# Patient Record
Sex: Female | Born: 1995 | Race: Black or African American | Hispanic: No | Marital: Single | Smoking: Never smoker
Health system: Southern US, Community
[De-identification: ages and names within clinical notes are randomized; demographics above are authoritative.]

## PROBLEM LIST (undated history)

## (undated) DIAGNOSIS — R569 Unspecified convulsions: Secondary | ICD-10-CM

---
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
## 2022-08-25 ENCOUNTER — Emergency Department (HOSPITAL_COMMUNITY)
Admission: EM | Admit: 2022-08-25 | Discharge: 2022-08-26 | Disposition: A | Discharge status: Home / Self Care | Payer: No Typology Code available for payment source | Source: Home / Self Care | Attending: Emergency Medicine | Admitting: Emergency Medicine

## 2022-08-25 ENCOUNTER — Encounter (HOSPITAL_COMMUNITY): Payer: Self-pay | Admitting: Emergency Medicine

## 2022-08-25 ENCOUNTER — Other Ambulatory Visit: Payer: Self-pay

## 2022-08-25 DIAGNOSIS — N9489 Other specified conditions associated with female genital organs and menstrual cycle: Secondary | ICD-10-CM | POA: Insufficient documentation

## 2022-08-25 DIAGNOSIS — G40909 Epilepsy, unspecified, not intractable, without status epilepticus: Secondary | ICD-10-CM | POA: Insufficient documentation

## 2022-08-25 DIAGNOSIS — N939 Abnormal uterine and vaginal bleeding, unspecified: Secondary | ICD-10-CM | POA: Insufficient documentation

## 2022-08-25 DIAGNOSIS — R569 Unspecified convulsions: Secondary | ICD-10-CM

## 2022-08-25 HISTORY — DX: Unspecified convulsions: R56.9

## 2022-08-25 LAB — URINALYSIS, ROUTINE W REFLEX MICROSCOPIC
Bilirubin Urine: NEGATIVE
Glucose, UA: NEGATIVE mg/dL
Hgb urine dipstick: NEGATIVE
Ketones, ur: NEGATIVE mg/dL
Leukocytes,Ua: NEGATIVE
Nitrite: NEGATIVE
Protein, ur: NEGATIVE mg/dL
Specific Gravity, Urine: 1.028 (ref 1.005–1.030)
pH: 5 (ref 5.0–8.0)

## 2022-08-25 LAB — CBC WITH DIFFERENTIAL/PLATELET
Abs Immature Granulocytes: 0.02 10*3/uL (ref 0.00–0.07)
Basophils Absolute: 0 10*3/uL (ref 0.0–0.1)
Basophils Relative: 0 %
Eosinophils Absolute: 0.1 10*3/uL (ref 0.0–0.5)
Eosinophils Relative: 2 %
HCT: 35.9 % — ABNORMAL LOW (ref 36.0–46.0)
Hemoglobin: 11.1 g/dL — ABNORMAL LOW (ref 12.0–15.0)
Immature Granulocytes: 0 %
Lymphocytes Relative: 21 %
Lymphs Abs: 1.4 10*3/uL (ref 0.7–4.0)
MCH: 22.4 pg — ABNORMAL LOW (ref 26.0–34.0)
MCHC: 30.9 g/dL (ref 30.0–36.0)
MCV: 72.5 fL — ABNORMAL LOW (ref 80.0–100.0)
Monocytes Absolute: 0.7 10*3/uL (ref 0.1–1.0)
Monocytes Relative: 10 %
Neutro Abs: 4.5 10*3/uL (ref 1.7–7.7)
Neutrophils Relative %: 67 %
Platelets: 309 10*3/uL (ref 150–400)
RBC: 4.95 MIL/uL (ref 3.87–5.11)
RDW: 14.9 % (ref 11.5–15.5)
WBC: 6.8 10*3/uL (ref 4.0–10.5)
nRBC: 0 % (ref 0.0–0.2)

## 2022-08-25 LAB — COMPREHENSIVE METABOLIC PANEL
ALT: 15 U/L (ref 0–44)
AST: 17 U/L (ref 15–41)
Albumin: 3.9 g/dL (ref 3.5–5.0)
Alkaline Phosphatase: 49 U/L (ref 38–126)
Anion gap: 7 (ref 5–15)
BUN: 12 mg/dL (ref 6–20)
CO2: 22 mmol/L (ref 22–32)
Calcium: 9.1 mg/dL (ref 8.9–10.3)
Chloride: 109 mmol/L (ref 98–111)
Creatinine, Ser: 0.89 mg/dL (ref 0.44–1.00)
GFR, Estimated: >60 mL/min (ref >60)
Glucose, Bld: 91 mg/dL (ref 70–99)
Potassium: 3.9 mmol/L (ref 3.5–5.1)
Sodium: 138 mmol/L (ref 135–145)
Total Bilirubin: 0.5 mg/dL (ref 0.3–1.2)
Total Protein: 7.1 g/dL (ref 6.5–8.1)

## 2022-08-25 LAB — I-STAT BETA HCG BLOOD, ED (MC, WL, AP ONLY): I-stat hCG, quantitative: <5 m[IU]/mL (ref <5)

## 2022-08-25 NOTE — ED Provider Triage Note (Signed)
Emergency Medicine Provider Triage Evaluation Note  Darlene Reed , a 25 y.o. female  was evaluated in triage.  Pt complains of recent seizure that took place most of yesterday and earlier this morning.  Also with some nausea however without vomiting.  Given Zofran by EMS on the way here, nausea improved.  Denies recent fevers, diarrhea, urinary or bowel incontinence, tongue biting/bleeding, though does endorse some intermittent chills.  Hx of nonepileptic seizures/pseudoseizures.  Review of Systems  Positive:  Negative: See above  Physical Exam  BP 102/75 (BP Location: Right Arm)   Pulse 82   Temp 98.2 F (36.8 C) (Oral)   Resp 18   Ht 5\' 8"  (1.727 m)   Wt 85.3 kg   SpO2 100%   BMI 28.59 kg/m  Gen:   Awake, no distress, sitting comfortably, AAOx4 Resp:  Normal effort  MSK:   Moves extremities without difficulty  Other:  No evidence of disorientation.  No evidence of urinary or bowel incontinence.  No evidence of tongue biting or bleeding in the mouth.  Accompanied by family member, confirms patient did not injure herself during said seizures.  Full ROM of upper extremities, no evidence of head injury.  Medical Decision Making  Medically screening exam initiated at 6:27 PM.  Appropriate orders placed.  Darlene Reed was informed that the remainder of the evaluation will be completed by another provider, this initial triage assessment does not replace that evaluation, and the importance of remaining in the ED until their evaluation is complete.     Prince Rome, PA-C 27/25/36 1831
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
## 2022-08-26 ENCOUNTER — Other Ambulatory Visit: Payer: Self-pay

## 2022-08-26 ENCOUNTER — Emergency Department (HOSPITAL_COMMUNITY): Payer: No Typology Code available for payment source

## 2022-08-26 ENCOUNTER — Encounter (HOSPITAL_COMMUNITY): Payer: Self-pay | Admitting: Emergency Medicine

## 2022-08-26 ENCOUNTER — Inpatient Hospital Stay (HOSPITAL_COMMUNITY)
Admission: EM | Admit: 2022-08-26 | Discharge: 2022-08-30 | DRG: 101 | Disposition: A | Discharge status: Home / Self Care | Payer: No Typology Code available for payment source | Attending: Internal Medicine | Admitting: Internal Medicine

## 2022-08-26 DIAGNOSIS — R569 Unspecified convulsions: Principal | ICD-10-CM

## 2022-08-26 DIAGNOSIS — Z79899 Other long term (current) drug therapy: Secondary | ICD-10-CM

## 2022-08-26 DIAGNOSIS — G40909 Epilepsy, unspecified, not intractable, without status epilepticus: Principal | ICD-10-CM | POA: Diagnosis present

## 2022-08-26 DIAGNOSIS — E876 Hypokalemia: Secondary | ICD-10-CM | POA: Diagnosis not present

## 2022-08-26 DIAGNOSIS — D509 Iron deficiency anemia, unspecified: Secondary | ICD-10-CM | POA: Diagnosis present

## 2022-08-26 DIAGNOSIS — F32A Depression, unspecified: Secondary | ICD-10-CM | POA: Diagnosis present

## 2022-08-26 MED ORDER — LORAZEPAM 2 MG/ML IJ SOLN
0.5000 mg | Freq: Once | INTRAMUSCULAR | Status: AC
  Administered 2022-08-26: 0.5 mg via INTRAVENOUS
  Filled 2022-08-26: qty 1

## 2022-08-26 MED ORDER — SODIUM CHLORIDE 0.9 % IV SOLN: Freq: Once | INTRAVENOUS | Status: AC
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
## 2022-08-26 NOTE — Discharge Instructions (Addendum)
The episodes you have been having seem most consistent with nonepileptiform seizure activity.  These type of episodes are typically triggered by stress whether that be physical or emotional.  These types of seizures do not respond to typical seizure medication, the primary therapy is a dealing with underlying stress.  Therapy can be extremely helpful in managing the seizures.  Continue your anxiety medication.  Follow-up with your neurologist and continue working on obtaining a neurology follow-up in [place] as well.  Your labs and CT scan are reassuring today.

## 2022-08-26 NOTE — ED Notes (Signed)
Pt ambulated to restroom with steady gait.

## 2022-08-26 NOTE — ED Notes (Signed)
Transported to CT 

## 2022-08-26 NOTE — ED Notes (Signed)
PATIENT STATED THAT IV WAS HURTING REMOVED IV  FROM LEFT AC
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
## 2022-08-26 NOTE — ED Provider Notes (Signed)
[facility]-EMERGENCY DEPT Provider Note   CSN: [id number] Arrival date & time: 08/26/22  1944     History  Chief Complaint  Patient presents with   Seizures    Darlene Reed is a 26 y.o. female.  HPI Patient has history of nonelectric seizure disorder.  She however has had increasing frequency over these past several days.  She had multiple episodes of seizure activity and was evaluated last night in the emergency department.  Patient's father reports he witnessed her to be very unresponsive and with seizure-like activity.  He reports he was very concerned long the episode lasted tonight.  Reports she was concerned that she might die.  Patient reports she is really not aware of the episodes when they happen.  Reports sometimes she has fatigue and headache afterwards but at this time is not having any specific symptoms.  He denies any drug or alcohol use.  Patient is currently in law school at [employer] but came home because she was having more frequent episodes and was concerned about not having family members in the area.  She denies she has any recent increased stressors.  Denies that she feels stressed.    Home Medications Prior to Admission medications   Medication Sig Start Date End Date Taking? Authorizing Provider  escitalopram (LEXAPRO) 10 MG tablet Take 10 mg by mouth daily. 08/10/22  Yes [provider]  ferrous sulfate 325 (65 FE) MG tablet Take 1 tablet (325 mg total) by mouth daily. 08/29/22 12/27/22 Yes Osvaldo Shipper, MD      Allergies    Patient has no known allergies.    Review of Systems   Review of Systems  Physical Exam Updated Vital Signs BP 107/77 (BP Location: Right Arm)   Pulse 84   Temp 98 F (36.7 C) (Oral)   Resp 19   Ht 5\' 8"  (1.727 m)   Wt 85.3 kg   SpO2 100%   BMI 28.59 kg/m  Physical Exam Constitutional:      Appearance: Normal appearance.  HENT:     Head: Normocephalic and atraumatic.     Nose: Nose normal.      Mouth/Throat:     Mouth: Mucous membranes are moist.     Pharynx: Oropharynx is clear.  Eyes:     Extraocular Movements: Extraocular movements intact.     Pupils: Pupils are equal, round, and reactive to light.  Cardiovascular:     Rate and Rhythm: Normal rate and regular rhythm.  Pulmonary:     Effort: Pulmonary effort is normal.     Breath sounds: Normal breath sounds.  Abdominal:     General: There is no distension.     Palpations: Abdomen is soft.     Tenderness: There is no abdominal tenderness. There is no guarding.  Musculoskeletal:        General: No swelling. Normal range of motion.     Cervical back: Neck supple.     Right lower leg: No edema.     Left lower leg: No edema.  Skin:    General: Skin is warm and dry.  Neurological:     General: No focal deficit present.     Mental Status: She is alert and oriented to person, place, and time.     Cranial Nerves: No cranial nerve deficit.     Motor: No weakness.     Coordination: Coordination normal.  Psychiatric:        Mood and Affect: Mood normal.  ED Results / Procedures / Treatments   Labs (all labs ordered are listed, but only abnormal results are displayed) Labs Reviewed  BASIC METABOLIC PANEL - Abnormal; Notable for the following components:      Result Value   Potassium 3.2 (*)    Glucose, Bld 134 (*)    Calcium 8.4 (*)    All other components within normal limits  COMPREHENSIVE METABOLIC PANEL - Abnormal; Notable for the following components:   Chloride 113 (*)    Glucose, Bld 156 (*)    Calcium 8.4 (*)    Albumin 3.4 (*)    Anion gap 4 (*)    All other components within normal limits  CBC - Abnormal; Notable for the following components:   Hemoglobin 9.8 (*)    HCT 31.9 (*)    MCV 73.0 (*)    MCH 22.4 (*)    All other components within normal limits  CBC - Abnormal; Notable for the following components:   RBC 5.21 (*)    Hemoglobin 11.3 (*)    MCV 72.0 (*)    MCH 21.7 (*)    All other  components within normal limits  COMPREHENSIVE METABOLIC PANEL - Abnormal; Notable for the following components:   Glucose, Bld 109 (*)    All other components within normal limits  VITAMIN B12 - Abnormal; Notable for the following components:   Vitamin B-12 1,371 (*)    All other components within normal limits  IRON AND TIBC - Abnormal; Notable for the following components:   Saturation Ratios 9 (*)    All other components within normal limits  FERRITIN - Abnormal; Notable for the following components:   Ferritin 8 (*)    All other components within normal limits  RAPID URINE DRUG SCREEN, HOSP PERFORMED  CK  LACTIC ACID, PLASMA  HIV ANTIBODY (ROUTINE TESTING W REFLEX)  MAGNESIUM  PHOSPHORUS  FOLATE  RETICULOCYTES    EKG None  Radiology No results found.  Procedures Procedures    Medications Ordered in ED Medications  0.9 %  sodium chloride infusion (75 mL/hr Intravenous New Bag/Given 08/27/22 0844)  LORazepam (ATIVAN) injection 0.5 mg (0.5 mg Intravenous Given 08/26/22 2019)  0.9 %  sodium chloride infusion (0 mLs Intravenous Stopped 08/27/22 0516)  potassium chloride SA (KLOR-CON M) CR tablet 40 mEq (40 mEq Oral Given 08/29/22 1428)    ED Course/ Medical Decision Making/ A&P                           Medical Decision Making Amount and/or Complexity of Data Reviewed Labs: ordered.  Risk Prescription drug management. Decision regarding hospitalization.   Patient had a protracted episode of being poorly responsive and seizure-like activity this evening.  This has been recurrent multiple episodes over the past couple of days.  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  She is back to normal mental status at this time.  Consult: Reviewed with neurology Dr.Khalindique.  He has reviewed the patient's EMR and prior evaluation.  He advises that she has significant increase in seizure-like activity, admission  and continuous monitoring may be appropriate to rule out occult epileptiform seizure.  CT head and labs from earlier today reviewed.  Medications administered are 0.5 mg Ativan for seizure and possible anxiety.  Potassium supplement orally.  Fluid resuscitation with sodium chloride.  Recheck: Patient is continued to do well.  She has  maintained normal mental status.  Vital signs have remained stable.  Consult: Reviewed Dr. Roel Cluck for admission.        Final Clinical Impression(s) / ED Diagnoses Final diagnoses:  Seizure-like activity ([place])    Rx / DC Orders ED Discharge Orders          Ordered    Discharge instructions       Comments: Please follow instructions as given by the neurologist.  No driving or operating any Machinery for 6 months.  Please be sure to follow-up with your neurologist in [place].  You were cared for by a hospitalist during your hospital stay. If you have any questions about your discharge medications or the care you received while you were in the hospital after you are discharged, you can call the unit and asked to speak with the hospitalist on call if the hospitalist that took care of you is not available. Once you are discharged, your primary care physician will handle any further medical issues. Please note that NO REFILLS for any discharge medications will be authorized once you are discharged, as it is imperative that you return to your primary care physician (or establish a relationship with a primary care physician if you do not have one) for your aftercare needs so that they can reassess your need for medications and monitor your lab values. If you do not have a primary care physician, you can call [phone] for a physician referral.   08/30/22 1101    Increase activity slowly        08/30/22 1101    Call MD for:  temperature >100.4        08/30/22 1101    Diet general        08/30/22 1101    Call MD for:  persistant nausea and vomiting         08/30/22 1101    Call MD for:  severe uncontrolled pain        08/30/22 1101    Call MD for:  difficulty breathing, headache or visual disturbances        08/30/22 1101    Call MD for:  persistant dizziness or light-headedness        08/30/22 1101    Call MD for:  extreme fatigue        08/30/22 1101    ferrous sulfate 325 (65 FE) MG tablet  Daily        08/29/22 1003              Charlesetta Shanks, MD 09/03/22 1930
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
## 2022-08-27 DIAGNOSIS — F32A Depression, unspecified: Secondary | ICD-10-CM

## 2022-08-27 DIAGNOSIS — Z79899 Other long term (current) drug therapy: Secondary | ICD-10-CM | POA: Diagnosis not present

## 2022-08-27 DIAGNOSIS — R569 Unspecified convulsions: Secondary | ICD-10-CM

## 2022-08-27 DIAGNOSIS — G40909 Epilepsy, unspecified, not intractable, without status epilepticus: Secondary | ICD-10-CM

## 2022-08-27 DIAGNOSIS — E876 Hypokalemia: Secondary | ICD-10-CM | POA: Diagnosis not present

## 2022-08-27 DIAGNOSIS — D509 Iron deficiency anemia, unspecified: Secondary | ICD-10-CM | POA: Diagnosis present

## 2022-08-27 LAB — CBC
HCT: 31.9 % — ABNORMAL LOW (ref 36.0–46.0)
Hemoglobin: 9.8 g/dL — ABNORMAL LOW (ref 12.0–15.0)
MCH: 22.4 pg — ABNORMAL LOW (ref 26.0–34.0)
MCHC: 30.7 g/dL (ref 30.0–36.0)
MCV: 73 fL — ABNORMAL LOW (ref 80.0–100.0)
Platelets: 260 10*3/uL (ref 150–400)
RBC: 4.37 MIL/uL (ref 3.87–5.11)
RDW: 14.9 % (ref 11.5–15.5)
WBC: 5.7 10*3/uL (ref 4.0–10.5)
nRBC: 0 % (ref 0.0–0.2)

## 2022-08-27 LAB — COMPREHENSIVE METABOLIC PANEL
ALT: 14 U/L (ref 0–44)
AST: 15 U/L (ref 15–41)
Albumin: 3.4 g/dL — ABNORMAL LOW (ref 3.5–5.0)
Alkaline Phosphatase: 43 U/L (ref 38–126)
Anion gap: 4 — ABNORMAL LOW (ref 5–15)
BUN: 14 mg/dL (ref 6–20)
CO2: 22 mmol/L (ref 22–32)
Calcium: 8.4 mg/dL — ABNORMAL LOW (ref 8.9–10.3)
Chloride: 113 mmol/L — ABNORMAL HIGH (ref 98–111)
Creatinine, Ser: 0.74 mg/dL (ref 0.44–1.00)
GFR, Estimated: >60 mL/min (ref >60)
Glucose, Bld: 156 mg/dL — ABNORMAL HIGH (ref 70–99)
Potassium: 3.7 mmol/L (ref 3.5–5.1)
Sodium: 139 mmol/L (ref 135–145)
Total Bilirubin: 0.5 mg/dL (ref 0.3–1.2)
Total Protein: 6.5 g/dL (ref 6.5–8.1)

## 2022-08-27 LAB — RAPID URINE DRUG SCREEN, HOSP PERFORMED
Amphetamines: NOT DETECTED
Barbiturates: NOT DETECTED
Benzodiazepines: NOT DETECTED
Cocaine: NOT DETECTED
Opiates: NOT DETECTED
Tetrahydrocannabinol: NOT DETECTED

## 2022-08-27 LAB — BASIC METABOLIC PANEL
Anion gap: 6 (ref 5–15)
BUN: 12 mg/dL (ref 6–20)
CO2: 22 mmol/L (ref 22–32)
Calcium: 8.4 mg/dL — ABNORMAL LOW (ref 8.9–10.3)
Chloride: 107 mmol/L (ref 98–111)
Creatinine, Ser: 0.78 mg/dL (ref 0.44–1.00)
GFR, Estimated: >60 mL/min (ref >60)
Glucose, Bld: 134 mg/dL — ABNORMAL HIGH (ref 70–99)
Potassium: 3.2 mmol/L — ABNORMAL LOW (ref 3.5–5.1)
Sodium: 135 mmol/L (ref 135–145)

## 2022-08-27 LAB — HIV ANTIBODY (ROUTINE TESTING W REFLEX): HIV Screen 4th Generation wRfx: NONREACTIVE

## 2022-08-27 LAB — CK: Total CK: 102 U/L (ref 38–234)

## 2022-08-27 LAB — PHOSPHORUS: Phosphorus: 4.1 mg/dL (ref 2.5–4.6)

## 2022-08-27 LAB — LACTIC ACID, PLASMA: Lactic Acid, Venous: 1.1 mmol/L (ref 0.5–1.9)

## 2022-08-27 LAB — MAGNESIUM: Magnesium: 2 mg/dL (ref 1.7–2.4)

## 2022-08-27 MED ORDER — ACETAMINOPHEN 650 MG RE SUPP: 650.0000 mg | Freq: Four times a day (QID) | RECTAL | Status: DC | PRN

## 2022-08-27 MED ORDER — SODIUM CHLORIDE 0.9 % IV SOLN
INTRAVENOUS | Status: AC
  Administered 2022-08-27: 75 mL/h via INTRAVENOUS

## 2022-08-27 MED ORDER — HYDROCODONE-ACETAMINOPHEN 5-325 MG PO TABS: 1.0000 | ORAL_TABLET | ORAL | Status: DC | PRN

## 2022-08-27 MED ORDER — ACETAMINOPHEN 325 MG PO TABS
650.0000 mg | ORAL_TABLET | Freq: Four times a day (QID) | ORAL | Status: DC | PRN
  Administered 2022-08-29 – 2022-08-30 (×2): 650 mg via ORAL
  Filled 2022-08-27 (×2): qty 2

## 2022-08-27 MED ORDER — LORAZEPAM 2 MG/ML IJ SOLN: 1.0000 mg | Freq: Once | INTRAMUSCULAR | Status: DC | PRN

## 2022-08-27 MED ORDER — ESCITALOPRAM OXALATE 10 MG PO TABS
10.0000 mg | ORAL_TABLET | Freq: Every day | ORAL | Status: DC
  Administered 2022-08-27 – 2022-08-30 (×4): 10 mg via ORAL
  Filled 2022-08-27 (×4): qty 1
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
## 2022-08-27 NOTE — Assessment & Plan Note (Signed)
Investigation of whether this is not an epileptic toyed seizure disorder versus epilepsy patient will undergo continuous EEG once transferred to [facility] neurology is aware. If patient has an event can attempt to abort using stimulation if does not help we can use Ativan as needed

## 2022-08-27 NOTE — H&P (Incomplete)
Darlene Reed KDX:[id number] DOB: 01-Nov-1996 DOA: 08/26/2022     PCP: Patient, No Pcp Per   Outpatient Specialists: * NONE CARDS: * Dr. NEphrology: *  Dr. NEurology *   Dr. Pulmonary *  Dr.  Oncology * Dr. Sandria Manly* Dr.  Deboraha Sprang, [facility]) No care team member to display Urology Dr. *  Patient arrived to ER on 08/26/22 at 1944 Referred by Attending Arby Barrette, MD   Patient coming from:    home Lives alone,     Chief Complaint:   Chief Complaint  Patient presents with  . Seizures    HPI: Darlene Reed is a 26 y.o. 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  As well as palpitations and some nausea on arrival blood pressure 105/68 heart rate 90 satting 98% on room air  Records reviewed patient was seen at [facility] on EKG had no Epitol Floyd activity but did not have any seizure-like episodes captured during this hospitalization she actually currently goes to law school in [place] but have not established neurologist there       Regarding pertinent Chronic problems: ***  ****Hyperlipidemia - *on statins {statin:315258}  Lipid Panel  No results found for: "CHOL", "TRIG", "HDL", "CHOLHDL", "VLDL", "LDLCALC", "LDLDIRECT", "LABVLDL"  ***HTN on   ***chronic CHF diastolic/systolic/ combined - last echo***  *** CAD  - On Aspirin, statin, betablocker, Plavix                 - *followed by cardiology                - last cardiac cath  The ASCVD Risk score (Arnett DK, et al., 2019) failed to calculate for the following reasons:   The 2019 ASCVD risk score is  only valid for ages 84 to 30    ***DM 2 - No results found for: "HGBA1C" ****on insulin, PO meds only, diet controlled  ***Hypothyroidism: No results found for: "TSH" on synthroid  *** Morbid obesity-   BMI Readings from Last 1 Encounters:  08/26/22 28.59 kg/m     *** Asthma -well *** controlled on home inhalers/ nebs f                        ***last no prior***admission  ***                       No ***history of intubation  *** COPD - not **followed by pulmonology *** not  on baseline oxygen  *L,    *** OSA -on nocturnal oxygen, *CPAP, *noncompliant with CPAP  *** Hx of CVA - *with/out residual deficits on Aspirin 81 mg, 325, Plavix  ***A. Fib -  - CHA2DS2 vas score **** CHA2DS2/VAS Stroke Risk Points      N/A >= 2 Points: High Risk  1 - 1.99 Points: Medium Risk  0 Points: Low Risk    Last Change: N/A      This score determines the patient's risk of having a stroke if the  patient has atrial fibrillation.  This score is not applicable to this patient. Components are not  calculated.     current  on anticoagulation with ****Coumadin  ***Xarelto,* Eliquis,  *** Not on anticoagulation secondary to Risk of Falls, *** recurrent bleeding         -  Rate control:  Currently controlled with ***Toprolol,  *Metoprolol,* Diltiazem, *Coreg          - Rhythm control: *** amiodarone, *flecainide  ***Hx of DVT/PE on - anticoagulation with ****Coumadin  ***Xarelto,* Eliquis,   ***CKD stage III*- baseline Cr **** Estimated Creatinine Clearance: 108.7 mL/min (by C-G formula based on SCr of 0.89 mg/dL).  Lab Results  Component Value Date   CREATININE 0.89 08/25/2022     **** Liver disease Computed MELD 3.0 unavailable. Necessary lab results were not found in the last year. Computed MELD-Na unavailable. Necessary lab results were not found in the last year.    ***BPH - on Flomax, Proscar    *** Dementia - on Aricept** Nemenda  *** Chronic anemia - baseline hg Hemoglobin  & Hematocrit  Recent Labs    08/25/22 1832  HGB 11.1*     While in ER:       Ordered  CT HEAD *** NON acute  CXR - ***NON acute  CTabd/pelvis - ***nonacute  CTA chest - ***nonacute, no PE, * no evidence of infiltrate  Following Medications were ordered in ER: Medications  0.9 %  sodium chloride infusion (has no administration in time range)  LORazepam (ATIVAN) injection 0.5 mg (0.5 mg Intravenous Given 08/26/22 2019)    _______________________________________________________ ER Provider Called:     DrMarland Kitchen  They Recommend admit to medicine *** Will see in AM  ***SEEN in ER   ED Triage Vitals [08/26/22 1955]  Enc Vitals Group     BP 108/74     Pulse Rate 69     Resp 18     Temp 98.7 F (37.1 C)     Temp Source Oral     SpO2 100 %     Weight 188 lb (85.3 kg)     Height 5\' 8"  (1.727 m)     Head Circumference      Peak Flow      Pain Score 0     Pain Loc      Pain Edu?      Excl. in GC?       _________________________________________ Significant initial  Findings: Abnormal Labs Reviewed - No abnormal labs to display   _________________________ Troponin ***ordered ECG: Ordered Personally reviewed and interpreted by me showing: HR : *** Rhythm: *NSR, Sinus tachycardia * A.fib. W RVR, RBBB, LBBB, Paced Ischemic changes*nonspecific changes, no evidence of ischemic changes QTC*   ____________________ This patient meets SIRS Criteria and may be septic. SIRS = Systemic Inflammatory Response Syndrome  Order a lactic acid level if needed AND/OR Initiate the sepsis protocol with the attached order set OR Click "Treating Associated Infection or Illness" if the patient is being treated for an infection that is a known cause of these abnormalities     The recent clinical data is shown below. Vitals:   08/26/22 2045 08/26/22 2100 08/26/22 2210 08/26/22 2332  BP: 125/85 (!) 125/94 (!) 119/90 107/67  Pulse: 95 91 77 77  Resp: 16 16 18 18   Temp:       TempSrc:      SpO2: 100% 100% 100% 99%  Weight:      Height:  WBC     Component Value Date/Time   WBC 6.8 08/25/2022 1832   LYMPHSABS 1.4 08/25/2022 1832   MONOABS 0.7 08/25/2022 1832   EOSABS 0.1 08/25/2022 1832   BASOSABS 0.0 08/25/2022 1832        Lactic Acid, Venous No results found for: "LATICACIDVEN"   Procalcitonin *** Ordered Lactic Acid, Venous No results found for: "LATICACIDVEN"   Procalcitonin *** Ordered   UA *** no evidence of UTI  ***Pending ***not ordered   Urine analysis:    Component Value Date/Time   COLORURINE YELLOW 08/25/2022 2008   APPEARANCEUR HAZY (A) 08/25/2022 2008   LABSPEC 1.028 08/25/2022 2008   PHURINE 5.0 08/25/2022 2008   GLUCOSEU NEGATIVE 08/25/2022 2008   HGBUR NEGATIVE 08/25/2022 2008   BILIRUBINUR NEGATIVE 08/25/2022 2008   KETONESUR NEGATIVE 08/25/2022 2008   PROTEINUR NEGATIVE 08/25/2022 2008   NITRITE NEGATIVE 08/25/2022 2008   LEUKOCYTESUR NEGATIVE 08/25/2022 2008    No results found for this or any previous visit.   _______________________________________________ Hospitalist was called for admission for *** There are no diagnoses linked to this encounter.   The following Work up has been ordered so far:  Orders Placed This Encounter  Procedures  . Basic metabolic panel  . Urine rapid drug screen (hosp performed)  . Consult to hospitalist  . Saline lock IV     OTHER Significant initial  Findings:  labs showing:    Recent Labs  Lab 08/25/22 1832  NA 138  K 3.9  CO2 22  GLUCOSE 91  BUN 12  CREATININE 0.89  CALCIUM 9.1    Cr  * stable,  Up from baseline see below Lab Results  Component Value Date   CREATININE 0.89 08/25/2022    Recent Labs  Lab 08/25/22 1832  AST 17  ALT 15  ALKPHOS 49  BILITOT 0.5  PROT 7.1  ALBUMIN 3.9   Lab Results  Component Value Date   CALCIUM 9.1 08/25/2022          Plt: Lab Results  Component Value Date   PLT 309 08/25/2022        COVID-19 Labs  No results for input(s): "DDIMER", "FERRITIN", "LDH", "CRP" in the last 72 hours.  No results found for: "SARSCOV2NAA"   Arterial ***Venous  Blood Gas result:  pH *** pCO2 ***; pO2 ***;     %O2 Sat ***.  ABG No results found for: "PHART", "PCO2ART", "PO2ART", "HCO3", "TCO2", "ACIDBASEDEF", "O2SAT"       Recent Labs  Lab 08/25/22 1832  WBC 6.8  NEUTROABS 4.5  HGB 11.1*  HCT 35.9*  MCV 72.5*  PLT 309    HG/HCT * stable,  Down *Up from baseline see below    Component Value Date/Time   HGB 11.1 (L) 08/25/2022 1832   HCT 35.9 (L) 08/25/2022 1832   MCV 72.5 (L) 08/25/2022 1832      No results for input(s): "LIPASE", "AMYLASE" in the last 168 hours. No results for input(s): "AMMONIA" in the last 168 hours.    Cardiac Panel (last 3 results) No results for input(s): "CKTOTAL", "CKMB", "TROPONINI", "RELINDX" in the last 72 hours.  .car BNP (last 3 results) No results for input(s): "BNP" in the last 8760 hours.    DM  labs:  HbA1C: No results for input(s): "HGBA1C" in the last 8760 hours.     CBG (last 3)  No results for input(s): "GLUCAP" in the last 72 hours.        Cultures: No  results found for: "SDES", "SPECREQUEST", "CULT", "REPTSTATUS"   Radiological Exams on Admission: CT Head Wo Contrast  Result Date: 08/26/2022 CLINICAL DATA:  Trauma.  Seizure-like episodes. EXAM: CT HEAD WITHOUT CONTRAST TECHNIQUE: Contiguous axial images were obtained from the base of the skull through the vertex without intravenous contrast. RADIATION DOSE REDUCTION: This exam was performed according to the departmental dose-optimization program which includes automated exposure control, adjustment of the mA and/or kV according to patient size and/or use of iterative reconstruction technique. COMPARISON:  None Available. FINDINGS: Brain: No acute intracranial hemorrhage. No focal mass lesion. No CT evidence of acute infarction. No midline shift or mass effect. No  hydrocephalus. Basilar cisterns are patent. Vascular: No hyperdense vessel or unexpected calcification. Skull: Normal. Negative for fracture or focal lesion. Sinuses/Orbits: Paranasal sinuses and mastoid air cells are clear. Orbits are clear. Other: None. IMPRESSION: Normal head CT Electronically Signed   By: Genevive Bi M.D.   On: 08/26/2022 09:49   _______________________________________________________________________________________________________ Latest  Blood pressure 107/67, pulse 77, temperature 98.7 F (37.1 C), temperature source Oral, resp. rate 18, height  (1.727 m), weight 85.3 kg, SpO2 99 %.   Vitals  labs and radiology finding personally reviewed  Review of Systems:    Pertinent positives include: ***  Constitutional:  No weight loss, night sweats, Fevers, chills, fatigue, weight loss  HEENT:  No headaches, Difficulty swallowing,Tooth/dental problems,Sore throat,  No sneezing, itching, ear ache, nasal congestion, post nasal drip,  Cardio-vascular:  No chest pain, Orthopnea, PND, anasarca, dizziness, palpitations.no Bilateral lower extremity swelling  GI:  No heartburn, indigestion, abdominal pain, nausea, vomiting, diarrhea, change in bowel habits, loss of appetite, melena, blood in stool, hematemesis Resp:  no shortness of breath at rest. No dyspnea on exertion, No excess mucus, no productive cough, No non-productive cough, No coughing up of blood.No change in color of mucus.No wheezing. Skin:  no rash or lesions. No jaundice GU:  no dysuria, change in color of urine, no urgency or frequency. No straining to urinate.  No flank pain.  Musculoskeletal:  No joint pain or no joint swelling. No decreased range of motion. No back pain.  Psych:  No change in mood or affect. No depression or anxiety. No memory loss.  Neuro: no localizing neurological complaints, no tingling, no weakness, no double vision, no gait abnormality, no slurred speech, no confusion  All  systems reviewed and apart from HOPI all are negative _______________________________________________________________________________________________ Past Medical History:   Past Medical History:  Diagnosis Date  . Seizures (HCC)       History reviewed. No pertinent surgical history.  Social History:  Ambulatory *** independently cane, walker  wheelchair bound, bed bound     reports that she has never smoked. She does not have any smokeless tobacco history on file. She reports that she does not drink alcohol and does not use drugs.     Family History: *** History reviewed. No pertinent family history. ______________________________________________________________________________________________ Allergies: No Known Allergies   Prior to Admission medications   Medication Sig Start Date End Date Taking? Authorizing Provider  escitalopram (LEXAPRO) 10 MG tablet Take 10 mg by mouth daily. 08/10/22  Yes [provider]    ___________________________________________________________________________________________________ Physical Exam:    08/26/2022   11:32 PM 08/26/2022   10:10 PM 08/26/2022    9:00 PM  Vitals with BMI  Systolic 107 119 161  Diastolic 67 90 94  Pulse 77 77 91     1. General:  in No ***Acute distress***increased  work of breathing ***complaining of severe pain****agitated * Chronically ill *well *cachectic *toxic acutely ill -appearing 2. Psychological: Alert and *** Oriented 3. Head/ENT:   Moist *** Dry Mucous Membranes                          Head Non traumatic, neck supple                          Normal *** Poor Dentition 4. SKIN: normal *** decreased Skin turgor,  Skin clean Dry and intact no rash 5. Heart: Regular rate and rhythm no*** Murmur, no Rub or gallop 6. Lungs: ***Clear to auscultation bilaterally, no wheezes or crackles   7. Abdomen: Soft, ***non-tender, Non distended *** obese ***bowel sounds present 8. Lower extremities:  no clubbing, cyanosis, no ***edema 9. Neurologically Grossly intact, moving all 4 extremities equally *** strength 5 out of 5 in all 4 extremities cranial nerves II through XII intact 10. MSK: Normal range of motion    Chart has been reviewed  ______________________________________________________________________________________________  Assessment/Plan  ***  Admitted for *** There are no diagnoses linked to this encounter.   Present on Admission: **None**     No problem-specific Assessment & Plan notes found for this encounter.    Other plan as per orders.  DVT prophylaxis:  SCD *** Lovenox       Code Status:    Code Status: Not on file FULL CODE *** DNR/DNI ***comfort care as per patient ***family  I had personally discussed CODE STATUS with patient and family* I had spent *min discussing goals of care and CODE STATUS    Family Communication:   Family not at  Bedside  plan of care was discussed on the phone with *** Son, Daughter, Wife, Husband, Sister, Brother , father, mother  Disposition Plan:   *** likely will need placement for rehabilitation                          Back to current facility when stable                            To home once workup is complete and patient is stable  ***Following barriers for discharge:                            Electrolytes corrected                               Anemia corrected                             Pain controlled with PO medications                               Afebrile, white count improving able to transition to PO antibiotics                             Will need to be able to tolerate PO  Will likely need home health, home O2, set up                           Will need consultants to evaluate patient prior to discharge  ****EXPECT DC tomorrow                    ***Would benefit from PT/OT eval prior to DC  Ordered                   Swallow eval - SLP ordered                    Diabetes care coordinator                   Transition of care consulted                   Nutrition    consulted                  Wound care  consulted                   Palliative care    consulted                   Behavioral health  consulted                    Consults called: ***    Admission status:  ED Disposition     None        Obs***  ***  inpatient     I Expect 2 midnight stay secondary to severity of patient's current illness need for inpatient interventions justified by the following: ***hemodynamic instability despite optimal treatment (tachycardia *hypotension * tachypnea *hypoxia, hypercapnia) * Severe lab/radiological/exam abnormalities including:     and extensive comorbidities including: *substance abuse  *Chronic pain *DM2  * CHF * CAD  * COPD/asthma *Morbid Obesity * CKD *dementia *liver disease *history of stroke with residual deficits *  malignancy, * sickle cell disease  History of amputation Chronic anticoagulation  That are currently affecting medical management.   I expect  patient to be hospitalized for 2 midnights requiring inpatient medical care.  Patient is at high risk for adverse outcome (such as loss of life or disability) if not treated.  Indication for inpatient stay as follows:  Severe change from baseline regarding mental status Hemodynamic instability despite maximal medical therapy,  ongoing suicidal ideations,  severe pain requiring acute inpatient management,  inability to maintain oral hydration   persistent chest pain despite medical management Need for operative/procedural  intervention New or worsening hypoxia   Need for IV antibiotics, IV fluids, IV rate controling medications, IV antihypertensives, IV pain medications, IV anticoagulation, need for biPAP    Level of care   *** tele  For 12H 24H     medical floor       progressive tele indefinitely please discontinue once patient no longer  qualifies COVID-19 Labs    No results found for: "SARSCOV2NAA"   Precautions: admitted as *** Covid Negative  ***asymptomatic screening protocol****PUI *** covid positive No active isolations ***If Covid PCR is negative  - please DC precautions - would need additional investigation given very high risk for false native test result    Critical***  Patient is critically ill due to  hemodynamic instability * respiratory failure *severe sepsis* ongoing chest pain*  They are at high risk for life/limb threatening clinical deterioration requiring frequent reassessment and modifications of care.  Services provided include examination of the patient, review of relevant ancillary tests, prescription of lifesaving therapies, review of medications and prophylactic therapy.  Total critical care time excluding separately billable procedures: 60*  Minutes.    Jalan Fariss 08/27/2022, 12:01 AM ***  [facility]     after 2 AM please page floor coverage PA If 7AM-7PM, please contact the day team taking care of the patient using [email or web address]   Patient was evaluated in the context of the global COVID-19 pandemic, which necessitated consideration that the patient might be at risk for infection with the SARS-CoV-2 virus that causes COVID-19. Institutional protocols and algorithms that pertain to the evaluation of patients at risk for COVID-19 are in a state of rapid change based on information released by regulatory bodies including the CDC and federal and state organizations. These policies and algorithms were followed during the patient's care.

## 2022-08-27 NOTE — Assessment & Plan Note (Signed)
Lexapro 10mg po daily

## 2022-08-27 NOTE — Progress Notes (Signed)
Patient seen and examined.  Admitted early morning hours by nighttime hospitalist.  See H&P and assessment plan.  Also reviewed neurology consultation.  On exam today, she tells me she woke up from sleep and feels better and does not have any trouble.  Apparently, she was brought to the ER with multiple seizure-like episodes in last 24 hours, initially seen in the ER and discharged home but came back with more episodes.  Remains a stable since admission.  Seen by neurology in the emergency room, suspected nonepileptic seizures.  However due to frequency of the episodes, recommended LTM EEG.  Patient will be going to [facility] whenever bed is available.  Until then we will continue to monitor her, seizure precautions, supervised mobility.  Currently no indication for antiseizure medications.   Same-day admit.  No charge visit.
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
## 2022-08-27 NOTE — ED Notes (Addendum)
Pt ambulated to the bathroom. Without assistance.

## 2022-08-28 ENCOUNTER — Encounter (HOSPITAL_COMMUNITY): Payer: Self-pay | Admitting: Internal Medicine

## 2022-08-28 ENCOUNTER — Inpatient Hospital Stay (HOSPITAL_COMMUNITY): Payer: No Typology Code available for payment source

## 2022-08-28 DIAGNOSIS — D509 Iron deficiency anemia, unspecified: Secondary | ICD-10-CM | POA: Diagnosis not present

## 2022-08-28 DIAGNOSIS — R569 Unspecified convulsions: Secondary | ICD-10-CM | POA: Diagnosis not present

## 2022-08-28 DIAGNOSIS — G40909 Epilepsy, unspecified, not intractable, without status epilepticus: Secondary | ICD-10-CM | POA: Diagnosis not present

## 2022-08-28 NOTE — Progress Notes (Signed)
[facility] PROGRESS NOTE   Elfie Costanza [facility]MC:[id number] DOB: 06-09-1996 DOA: 08/26/2022  PCP: Patient, No Pcp Per  Brief History/Interval Summary:  26 y.o. 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  Presented with multiple episodes of seizures at home.  Apparently she was taken off of Keppra because her seizures are considered nonepileptic and that they were not helpful.  Patient mentioned that she had an abortion about 2 weeks prior to admission and had been experiencing light bleeding and cramping.  She was hospitalized for further management.  Neurology was consulted.    Consultants: Neurology  Procedures: Continuous EEG    Subjective/Interval History: Patient denies any headache.  No seizures in the last 24 hours.    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  Continuous EEG is recommended.  Patient was initially admitted in the hospital.  Transferred here overnight.  Discussed with Dr. Selina Cooley who will order long-term monitoring. Currently not on any antiepileptics.  History of depression Continue Lexapro  Recent abortion hCG was less than 5.0.  Experiencing occasional vaginal spotting.  Microcytic anemia We will check anemia panel.  No overt bleeding otherwise.  Hypokalemia Repleted.  Magnesium is 2.0.   DVT Prophylaxis: SCDs Code Status: Full code Family Communication: Discussed with patient Disposition Plan: Hopefully return home in improved  Status is: Inpatient Remains inpatient appropriate because: Concern for seizures      Medications: Scheduled:  escitalopram  10 mg Oral Daily   Continuous: MOQ:[id number] **OR** acetaminophen, HYDROcodone-acetaminophen, LORazepam  Antibiotics: Anti-infectives (From admission, onward)    None       Objective:  Vital Signs  Vitals:   08/27/22 2132 08/27/22 2342  08/28/22 0353 08/28/22 0732  BP: 110/73 124/82 (!) 116/91 115/77  Pulse: 81 84 72 81  Resp: 19 18 14 19   Temp: 98.7 F (37.1 C) 98.4 F (36.9 C) 98.2 F (36.8 C) 98.5 F (36.9 C)  TempSrc: Oral Oral Oral Oral  SpO2: 99% 100% 100% 100%  Weight:      Height:       No intake or output data in the 24 hours ending 08/28/22 1011 Filed Weights   08/26/22 1955  Weight: 85.3 kg    General appearance: Awake alert.  In no distress Resp: Clear to auscultation bilaterally.  Normal effort Cardio: S1-S2 is normal regular.  No S3-S4.  No rubs murmurs or bruit GI: Abdomen is soft.  Nontender nondistended.  Bowel sounds are present normal.  No masses organomegaly Extremities: No edema.  Full range of motion of lower extremities. Neurologic: Alert and oriented x3.  No focal neurological deficits.    Lab Results:  Data Reviewed: I have personally reviewed following labs and reports of the imaging studies  CBC: Recent Labs  Lab 08/25/22 1832 08/27/22 0515  WBC 6.8 5.7  NEUTROABS 4.5  --   HGB 11.1* 9.8*  HCT 35.9* 31.9*  MCV 72.5* 73.0*  PLT 309 260    Basic Metabolic Panel: Recent Labs  Lab 08/25/22 1832 08/27/22 0034 08/27/22 0515  NA 138 135 139  K 3.9 3.2* 3.7  CL 109 107 113*  CO2 22 22 22   GLUCOSE 91 134* 156*  BUN 12 12 14   CREATININE 0.89 0.78 0.74  CALCIUM 9.1 8.4* 8.4*  MG  --   --  2.0  PHOS  --   --  4.1    GFR: Estimated Creatinine Clearance: 120.9 mL/min (by C-G  formula based on SCr of 0.74 mg/dL).  Liver Function Tests: Recent Labs  Lab 08/25/22 1832 08/27/22 0515  AST 17 15  ALT 15 14  ALKPHOS 49 43  BILITOT 0.5 0.5  PROT 7.1 6.5  ALBUMIN 3.9 3.4*     Cardiac Enzymes: Recent Labs  Lab 08/27/22 0034  CKTOTAL 102     Radiology Studies: No results found.     LOS: 1 day   Kariya Lavergne [employer] on [email or web address]  08/28/2022, 10:11 AM
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
## 2022-08-28 NOTE — Progress Notes (Signed)
Can not get pt online, I am still troubleshooting

## 2022-08-28 NOTE — Progress Notes (Signed)
Patient was sitting in bed eating; visitor called out, "that she stopped talking; upper body began to shake"; entered room quickly no shaking or tremor observed; patient is nonverbal and eyes are open but staring straight forward; episode lasted 2 minutes; then she began to relax her arms and speak; return to baseline; doesn't recall it happening; no aura reported; vital signs stable; continue to monitor.
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
## 2022-08-28 NOTE — Progress Notes (Signed)
LTM EEG hooked up and running - no initial skin breakdown - push button tested - neuro notified. [facility] monitoring.  
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
## 2022-08-29 DIAGNOSIS — D509 Iron deficiency anemia, unspecified: Secondary | ICD-10-CM | POA: Diagnosis not present

## 2022-08-29 DIAGNOSIS — G40909 Epilepsy, unspecified, not intractable, without status epilepticus: Secondary | ICD-10-CM | POA: Diagnosis not present

## 2022-08-29 DIAGNOSIS — R569 Unspecified convulsions: Secondary | ICD-10-CM | POA: Diagnosis not present

## 2022-08-29 LAB — COMPREHENSIVE METABOLIC PANEL
ALT: 16 U/L (ref 0–44)
AST: 15 U/L (ref 15–41)
Albumin: 3.7 g/dL (ref 3.5–5.0)
Alkaline Phosphatase: 49 U/L (ref 38–126)
Anion gap: 9 (ref 5–15)
BUN: 8 mg/dL (ref 6–20)
CO2: 23 mmol/L (ref 22–32)
Calcium: 9 mg/dL (ref 8.9–10.3)
Chloride: 104 mmol/L (ref 98–111)
Creatinine, Ser: 0.84 mg/dL (ref 0.44–1.00)
GFR, Estimated: >60 mL/min (ref >60)
Glucose, Bld: 109 mg/dL — ABNORMAL HIGH (ref 70–99)
Potassium: 3.6 mmol/L (ref 3.5–5.1)
Sodium: 136 mmol/L (ref 135–145)
Total Bilirubin: 0.5 mg/dL (ref 0.3–1.2)
Total Protein: 7.1 g/dL (ref 6.5–8.1)

## 2022-08-29 LAB — CBC
HCT: 37.5 % (ref 36.0–46.0)
Hemoglobin: 11.3 g/dL — ABNORMAL LOW (ref 12.0–15.0)
MCH: 21.7 pg — ABNORMAL LOW (ref 26.0–34.0)
MCHC: 30.1 g/dL (ref 30.0–36.0)
MCV: 72 fL — ABNORMAL LOW (ref 80.0–100.0)
Platelets: 311 10*3/uL (ref 150–400)
RBC: 5.21 MIL/uL — ABNORMAL HIGH (ref 3.87–5.11)
RDW: 14.7 % (ref 11.5–15.5)
WBC: 7.2 10*3/uL (ref 4.0–10.5)
nRBC: 0 % (ref 0.0–0.2)

## 2022-08-29 LAB — IRON AND TIBC
Iron: 33 ug/dL (ref 28–170)
Saturation Ratios: 9 % — ABNORMAL LOW (ref 10.4–31.8)
TIBC: 365 ug/dL (ref 250–450)
UIBC: 332 ug/dL

## 2022-08-29 LAB — FERRITIN: Ferritin: 8 ng/mL — ABNORMAL LOW (ref 11–307)

## 2022-08-29 LAB — FOLATE: Folate: 12 ng/mL (ref >5.9)

## 2022-08-29 LAB — VITAMIN B12: Vitamin B-12: 1371 pg/mL — ABNORMAL HIGH (ref 180–914)

## 2022-08-29 LAB — RETICULOCYTES
Immature Retic Fract: 15.2 % (ref 2.3–15.9)
RBC.: 5 MIL/uL (ref 3.87–5.11)
Retic Count, Absolute: 62 10*3/uL (ref 19.0–186.0)
Retic Ct Pct: 1.2 % (ref 0.4–3.1)

## 2022-08-29 MED ORDER — POTASSIUM CHLORIDE CRYS ER 20 MEQ PO TBCR
40.0000 meq | EXTENDED_RELEASE_TABLET | Freq: Once | ORAL | Status: AC
  Administered 2022-08-29: 40 meq via ORAL
  Filled 2022-08-29: qty 2

## 2022-08-29 MED ORDER — SIMETHICONE 80 MG PO CHEW: 80.0000 mg | CHEWABLE_TABLET | Freq: Once | ORAL | Status: DC | PRN

## 2022-08-29 MED ORDER — FERROUS SULFATE 325 (65 FE) MG PO TABS: 325.0000 mg | ORAL_TABLET | Freq: Every day | ORAL | 3 refills | Status: AC
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
## 2022-08-29 NOTE — Progress Notes (Signed)
Patient suddenly stopped talking, closed her eyes, mild shaking upper body, drooling white and thin mucous from mouth, used suction ancor, stated coughing, mild teeth locked, it was occurred for 2-3 mins and started calming down relaxed and seemed sound sleep, opened eyes for few second and again closed eyes, seemed mild episode. Also seemed about to cry, but denied of stressed, worried, or anxious. In and out episode was there for 10 mins. After that episode pt said that lost her hearing 10-15 minutes and which is not new symptoms per pt. Same time pt said that her eyes seems floating this is also been there for few days and will go away in 10-15 mins. Pt's Vts are WIN, Oncall provider and  Neurologist both were notified and will continue to monitor closely.

## 2022-08-30 ENCOUNTER — Encounter: Payer: Self-pay | Admitting: Neurology

## 2022-08-30 DIAGNOSIS — R569 Unspecified convulsions: Secondary | ICD-10-CM | POA: Diagnosis not present

## 2022-08-30 DIAGNOSIS — D509 Iron deficiency anemia, unspecified: Secondary | ICD-10-CM | POA: Diagnosis not present

## 2022-08-30 NOTE — Progress Notes (Signed)
DC and atrium notified.

## 2022-08-30 NOTE — Progress Notes (Addendum)
Subjective: Per father at bedside, she has episodes of whole body stiffening/spasms sometimes gets floppy, does not respond.  Patient states she would like to go home.  Also states she has a follow-up scheduled with neurology in [place].  ROS: negative except above  Examination  Vital signs in last 24 hours: Temp:  [97.8 F (36.6 C)-99.1 F (37.3 C)] 98.7 F (37.1 C) (10/30 0718) Pulse Rate:  [71-94] 82 (10/30 0718) Resp:  [16-19] 18 (10/30 0718) BP: (110-121)/(66-83) 111/81 (10/30 0718) SpO2:  [98 %-100 %] 100 % (10/30 0718)  General: lying in bed, NAD  Neuro: MS: Alert, oriented, follows commands CN: pupils equal and reactive,  EOMI, face symmetric, tongue midline, normal sensation over face Motor: 5/5 strength in all 4 extremities   Basic Metabolic Panel: Recent Labs  Lab 08/25/22 1832 08/27/22 0034 08/27/22 0515 08/29/22 0253  NA 138 135 139 136  K 3.9 3.2* 3.7 3.6  CL 109 107 113* 104  CO2 22 22 22 23   GLUCOSE 91 134* 156* 109*  BUN 12 12 14 8   CREATININE 0.89 0.78 0.74 0.84  CALCIUM 9.1 8.4* 8.4* 9.0  MG  --   --  2.0  --   PHOS  --   --  4.1  --     CBC: Recent Labs  Lab 08/25/22 1832 08/27/22 0515 08/29/22 0253  WBC 6.8 5.7 7.2  NEUTROABS 4.5  --   --   HGB 11.1* 9.8* 11.3*  HCT 35.9* 31.9* 37.5  MCV 72.5* 73.0* 72.0*  PLT 309 260 311     Coagulation Studies: No results for input(s): "LABPROT", "INR" in the last 72 hours.  Imaging CT head without contrast 08/26/2022: Normal head CT  ASSESSMENT AND PLAN: 26 year old female with new onset episodes of whole body stiffening, jerking with normal EEG.  Nonepileptic events -Patient has been on video EEG for 48 hours without any epileptic ictal abnormality.  2 events were noted during which patient had several twitching, decreased responsiveness which were subtle than prior events at home and did not show any EEG change.  Recommendations  -Discussed the diagnosis of nonepileptic events with  patient, mother and father at bedside.  Patient and family expressed understanding and agreed with the diagnosis. -Recommended follow-up with psychiatry/therapy for cognitive behavioral therapy -Has been on Keppra before and did not help.  Is not interested in starting any medications. -Discussed seizure precautions including do not drive -Patient also requested a letter for school as she has missed significant days of school.  I have provided this letter. -Follow-up scheduled with neurology in [place] discussed with Dr. [place] Pink  Seizure precautions: Per [facility] statutes, patients with seizures are not allowed to drive until they have been seizure-free for six months and cleared by a physician    Use caution when using heavy equipment or power tools. Avoid working on ladders or at heights. Take showers instead of baths. Ensure the water temperature is not too high on the home water heater. Do not go swimming alone. Do not lock yourself in a room alone (i.e. bathroom). When caring for infants or small children, sit down when holding, feeding, or changing them to minimize risk of injury to the child in the event you have a seizure. Maintain good sleep hygiene. Avoid alcohol.    If patient has another seizure, call 911 and bring them back to the ED if: A.  The seizure lasts longer than 5 minutes.      B.  The patient doesn't wake shortly after the seizure or has new problems such as difficulty seeing, speaking or moving following the seizure C.  The patient was injured during the seizure D.  The patient has a temperature over 102 F (39C) E.  The patient vomited during the seizure and now is having trouble breathing    During the Seizure   - First, ensure adequate ventilation and place patients on the floor on their left side  Loosen clothing around the neck and ensure the airway is patent. If the patient is clenching the teeth, do not force the mouth open with any  object as this can cause severe damage - Remove all items from the surrounding that can be hazardous. The patient may be oblivious to what's happening and may not even know what he or she is doing. If the patient is confused and wandering, either gently guide him/her away and block access to outside areas - Reassure the individual and be comforting - Call 911. In most cases, the seizure ends before EMS arrives. However, there are cases when seizures may last over 3 to 5 minutes. Or the individual may have developed breathing difficulties or severe injuries. If a pregnant patient or a person with diabetes develops a seizure, it is prudent to call an ambulance. - Finally, if the patient does not regain full consciousness, then call EMS. Most patients will remain confused for about 45 to 90 minutes after a seizure, so you must use judgment in calling for help.    After the Seizure (Postictal Stage)   After a seizure, most patients experience confusion, fatigue, muscle pain and/or a headache. Thus, one should permit the individual to sleep. For the next few days, reassurance is essential. Being calm and helping reorient the person is also of importance.   Most seizures are painless and end spontaneously. Seizures are not harmful to others but can lead to complications such as stress on the lungs, brain and the heart. Individuals with prior lung problems may develop labored breathing and respiratory distress.     I have spent a total of 36  minutes with the patient reviewing hospital notes,  test results, labs and examining the patient as well as establishing an assessment and plan that was discussed personally with the patient.  > 50% of time was spent in direct patient care.   Lindie Spruce Epilepsy [facility] For questions after 5pm please refer to AMION to reach the Neurologist on call

## 2022-08-30 NOTE — Progress Notes (Signed)
vLTM maintenance  All impedances below 10k.  No skin breakdown noted at all skin sites 

## 2022-08-30 NOTE — TOC Transition Note (Signed)
Transition of Care [facility]) - CM/SW Discharge Note   Patient Details  Name: Darlene Reed MRN: [id number] Date of Birth: [birth year]  [facility] CM/SW Contact:  Pollie Friar, RN Phone Number: 08/30/2022, 10:59 AM   Clinical Narrative:    Pt is currently enrolled in school in [place]. She states she has friends that provide transportation as needed. They can also check in on her.  She denies any issues with her home medications.  She doesn't have a PCP in [place] but has been working on getting one and is on waiting list for a neurologist. CM encouraged her to use the schools clinic for PCP if needed.  Pt has transportation home when discharged.    Final next level of care: Home/Self Care Barriers to Discharge: No Barriers Identified   Patient Goals and CMS Choice        Discharge Placement                       Discharge Plan and Services                                     Social Determinants of Health (SDOH) Interventions     Readmission Risk Interventions     No data to display
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
## 2022-08-30 NOTE — Progress Notes (Signed)
Patient called nurse, then nurse went in pt's room. Pt was moaning, emotional, seemed trying to copp but then started crying. Requested to nurse to call her dad.( Pt's dad is in bedside at this time.)Trying to reassure her; still in few minutes pt ask if she can leave the hospital now with on her own. Paged on call provider and waiting for Dr. to came and talk to patient. Until then we will continue to monitor pt closely.
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
## 2022-08-30 NOTE — Progress Notes (Signed)
LTM EEG disconnected - no skin breakdown at unhook.  
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
